# Patient Record
Sex: Female | Born: 1955 | Race: White | Hispanic: No | Marital: Single | Smoking: Current every day smoker
Health system: Southern US, Community
[De-identification: ages and names within clinical notes are randomized; demographics above are authoritative.]

## PROBLEM LIST (undated history)

## (undated) DIAGNOSIS — N183 Chronic kidney disease, stage 3 unspecified: Secondary | ICD-10-CM

## (undated) DIAGNOSIS — K589 Irritable bowel syndrome without diarrhea: Secondary | ICD-10-CM

## (undated) DIAGNOSIS — K604 Rectal fistula, unspecified: Secondary | ICD-10-CM

## (undated) DIAGNOSIS — K5090 Crohn's disease, unspecified, without complications: Secondary | ICD-10-CM

## (undated) HISTORY — DX: Chronic kidney disease, stage 3 unspecified: N18.30

## (undated) HISTORY — DX: Rectal fistula, unspecified: K60.40

## (undated) HISTORY — PX: CHOLECYSTECTOMY: SHX55

## (undated) HISTORY — DX: Rectal fistula: K60.4

## (undated) HISTORY — DX: Chronic kidney disease, stage 3 (moderate): N18.3

## (undated) HISTORY — DX: Crohn's disease, unspecified, without complications: K50.90

## (undated) HISTORY — PX: OTHER SURGICAL HISTORY: SHX169

---

## 2004-06-15 ENCOUNTER — Ambulatory Visit: Payer: Self-pay

## 2004-08-01 ENCOUNTER — Emergency Department: Payer: Self-pay | Admitting: Emergency Medicine

## 2004-11-23 ENCOUNTER — Ambulatory Visit: Payer: Self-pay

## 2005-01-05 ENCOUNTER — Ambulatory Visit: Payer: Self-pay | Admitting: Gastroenterology

## 2005-01-26 ENCOUNTER — Ambulatory Visit: Payer: Self-pay | Admitting: Gastroenterology

## 2005-02-14 ENCOUNTER — Ambulatory Visit: Payer: Self-pay | Admitting: Gastroenterology

## 2006-09-04 ENCOUNTER — Ambulatory Visit: Payer: Self-pay | Admitting: Gastroenterology

## 2006-09-27 ENCOUNTER — Ambulatory Visit: Payer: Self-pay | Admitting: Gastroenterology

## 2008-04-14 IMAGING — CT CT ABD-PELV W/ CM
1 of 3 series · 12 of 32 positions shown, 18 images · non-contrast
Comparison: none

REASON FOR EXAM: Crohn's  Disease Large and Small Intestine
COMMENTS:

PROCEDURE:     CT  - CT ABDOMEN / PELVIS  W  - September 27, 2006  [DATE]
RESULT:
TECHNIQUE: Axial images were obtained from the hemidiaphragm to the pubic
symphysis status post intravenous and oral administration of contrast
material.

[Series 2: soft tissue · axial · 0.84mm/px · z∈[-353,+71]mm · 12 of 63 slices shown, 18 images]
[im 5/63  soft-tissue]
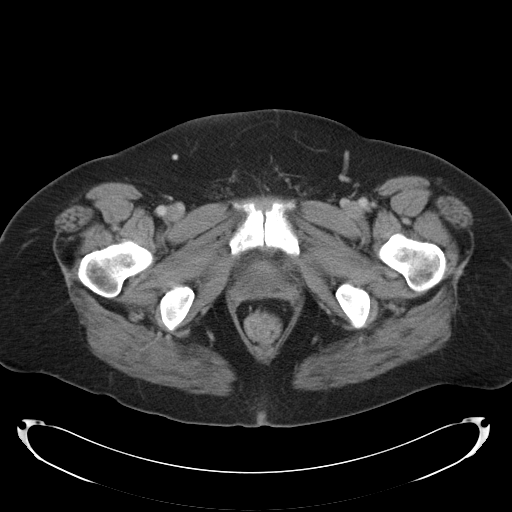
[im 5/63  bone]
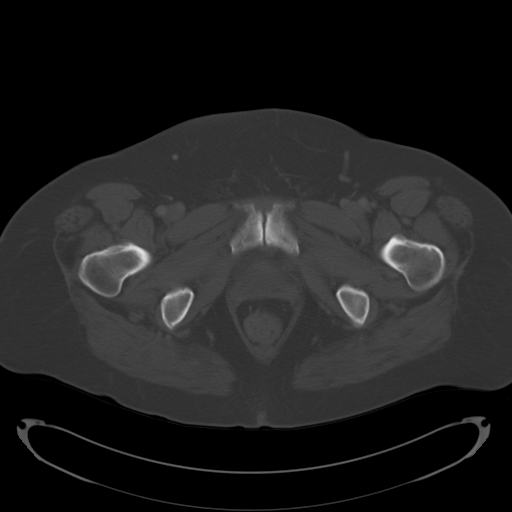
[im 10/63  soft-tissue]
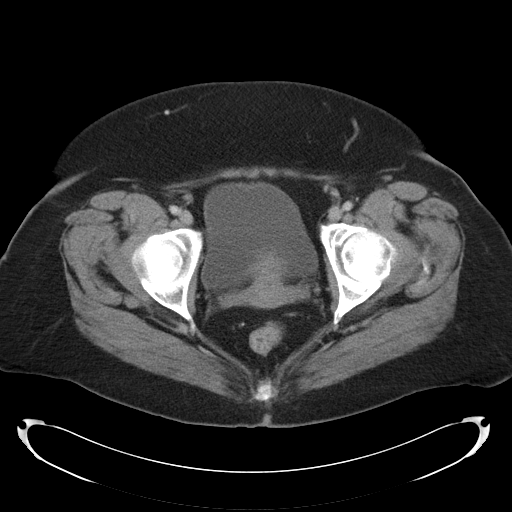
[im 15/63  soft-tissue]
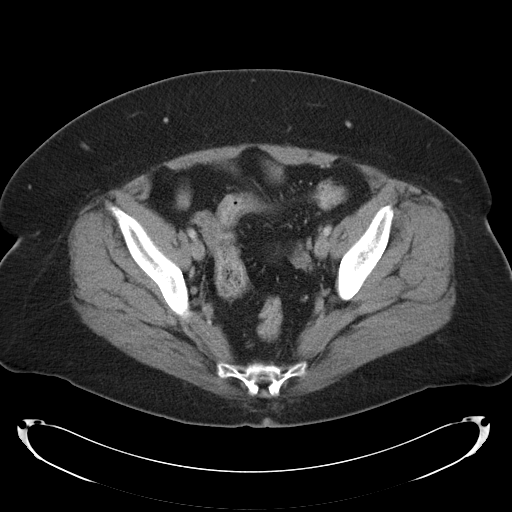
[im 20/63  soft-tissue]
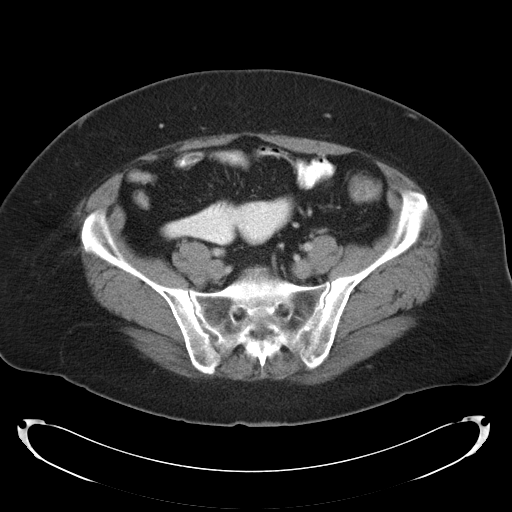
[im 24/63  soft-tissue]
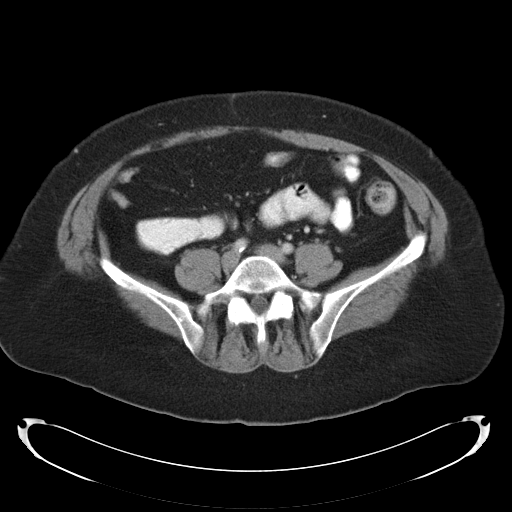
[im 29/63  soft-tissue]
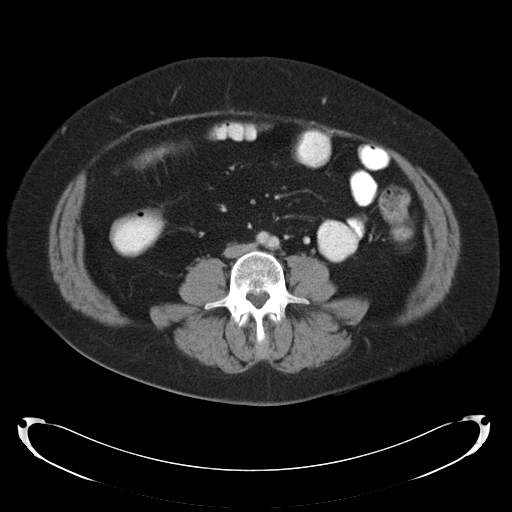
[im 34/63  soft-tissue]
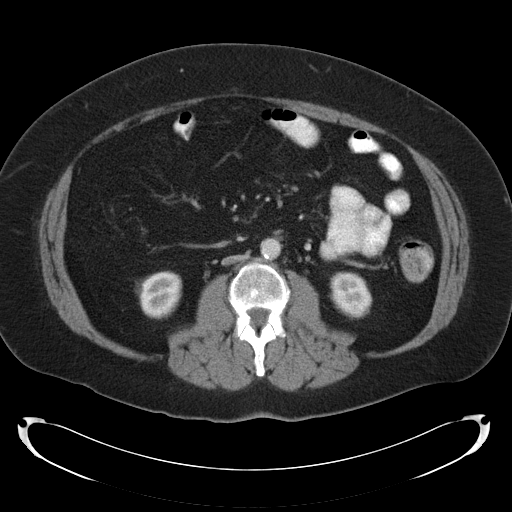
[im 39/63  soft-tissue]
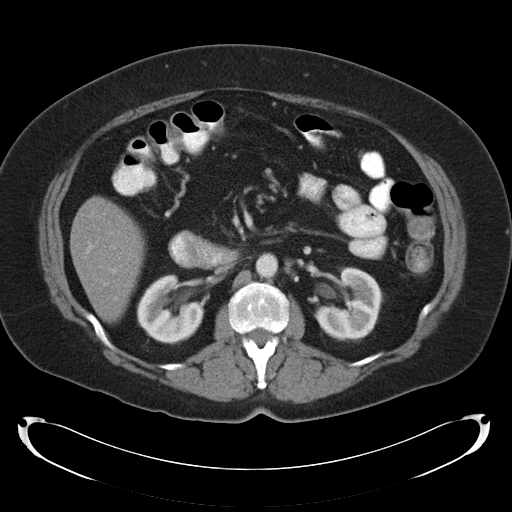
[im 43/63  soft-tissue]
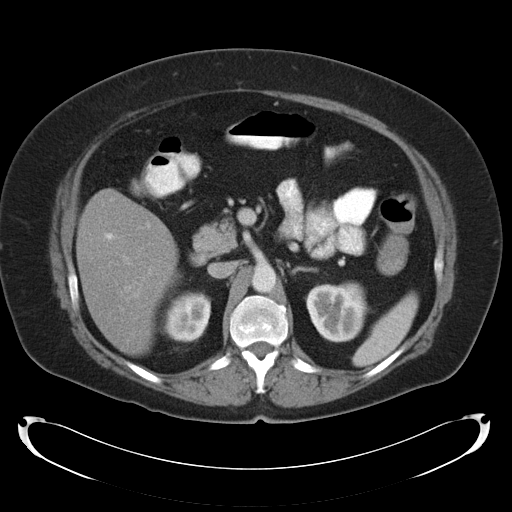
[im 43/63  lung]
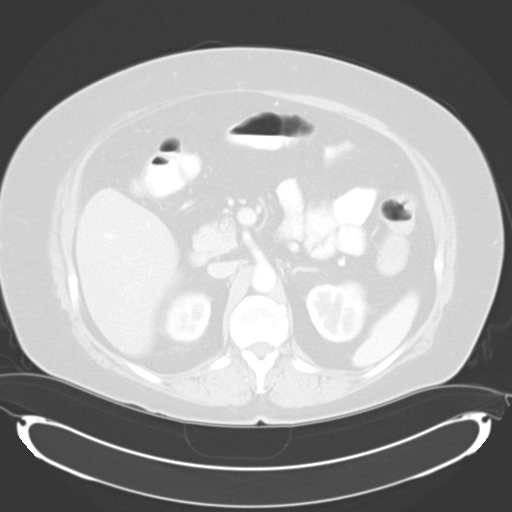
[im 43/63  bone]
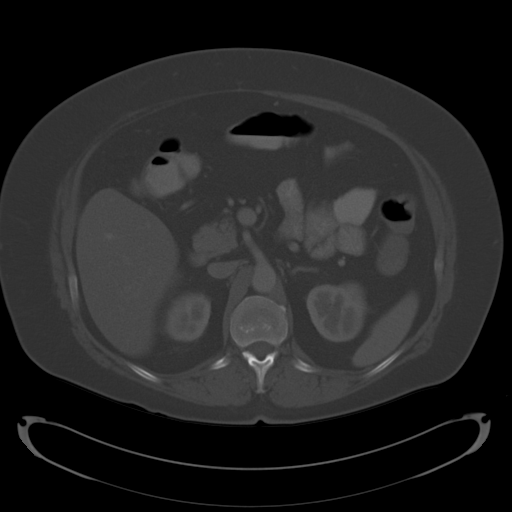
[im 48/63  soft-tissue]
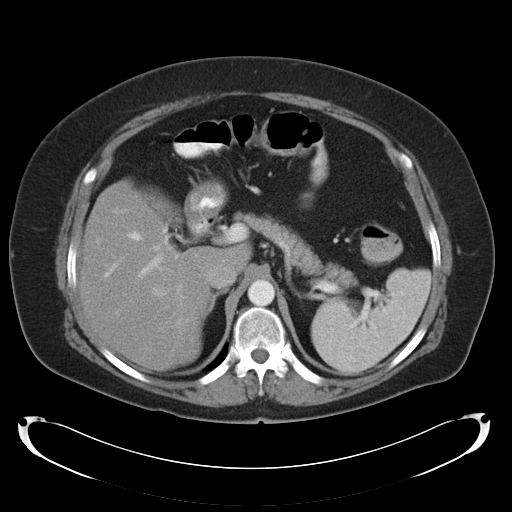
[im 48/63  lung]
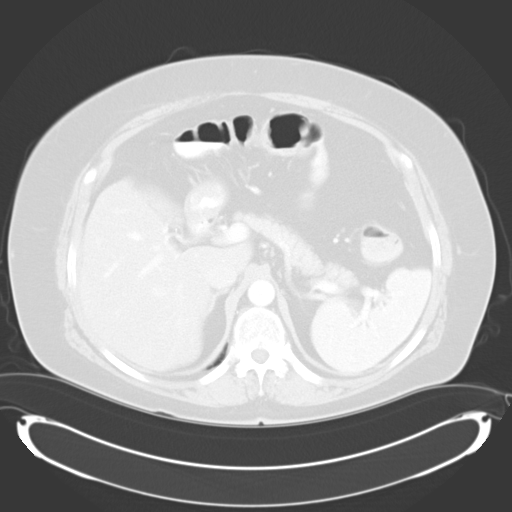
[im 53/63  soft-tissue]
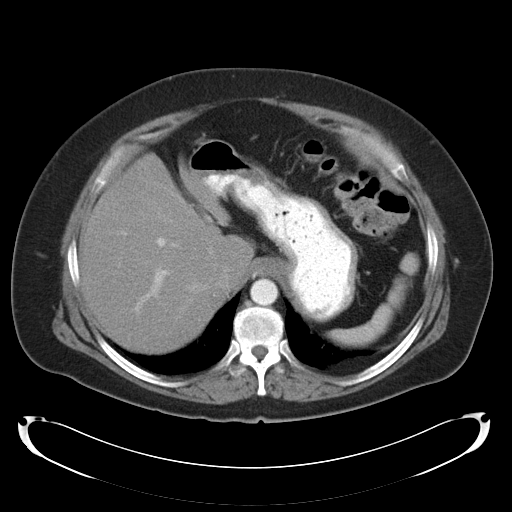
[im 53/63  lung]
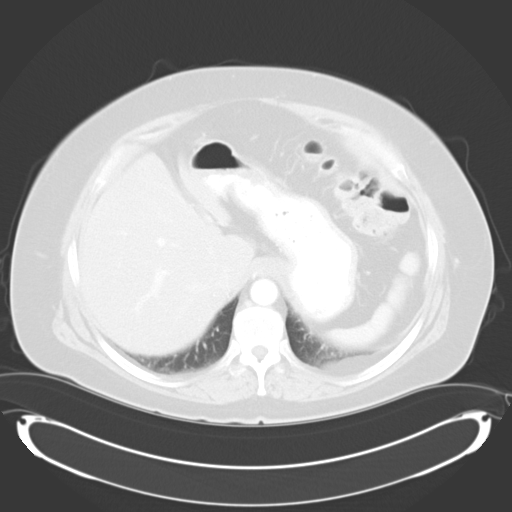
[im 58/63  soft-tissue]
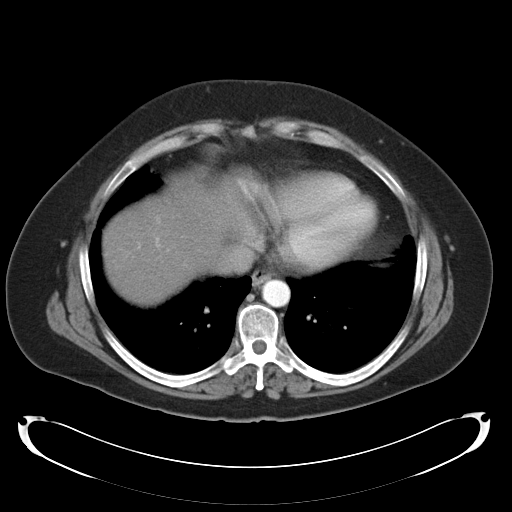
[im 58/63  lung]
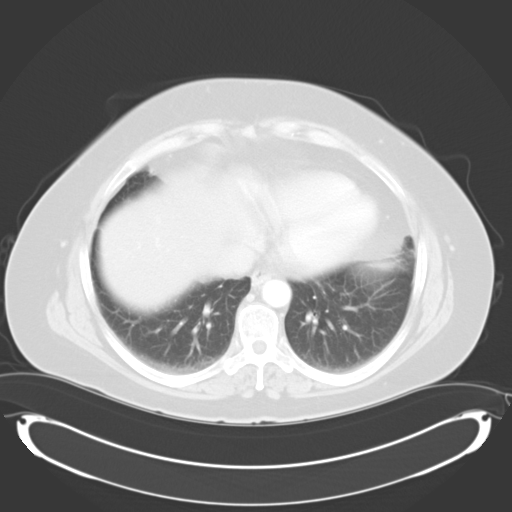

[12 of 32 positions shown; findings below may reference images not displayed]

FINDINGS: The liver and spleen appear intact.  The patient is status post
cholecystectomy.  Adrenal glands are intact.  Both kidneys excrete the
contrast.  There appears a cyst in the mid LEFT kidney. There is a loop of
small bowel in the abdomen on the RIGHT with thickened wall.  Some
dilatation towards the midline. This could be compatible with the patient's
Crohn's disease. There also may be some involvement distally just above the
urinary bladder anteriorly.  There appears some thickening of the bowel. No
ascites is present. No adenopathy is visualized. The lung bases are clear.
IMPRESSION: Some thickened loops of small bowel which can be associated with patient's
known Chron's. LEFT renal cyst present.

## 2009-03-04 ENCOUNTER — Ambulatory Visit: Payer: Self-pay | Admitting: Gastroenterology

## 2009-05-28 ENCOUNTER — Ambulatory Visit: Payer: Self-pay | Admitting: Gastroenterology

## 2009-07-12 ENCOUNTER — Other Ambulatory Visit: Payer: Self-pay | Admitting: Nephrology

## 2009-07-13 ENCOUNTER — Observation Stay: Payer: Self-pay | Admitting: Nephrology

## 2011-01-29 IMAGING — US ULTRASOUND CORE BIOPSY
1 series · 18 of 21 positions shown · non-contrast
Comparison: none

REASON FOR EXAM: ckd 3, subnephrotic proteinuria, positive Torres Jara
COMMENTS:

PROCEDURE:     US  - US GUIDED BX/ASPIRATION NOT BR  - July 13, 2009 [DATE]
RESULT:     Ultrasound-directed biopsy was performed by Dr. Balaoui. Images
obtained revealed no focal abnormality.

[Series 1: ultrasound core biopsy · 18 of 21 slices shown]
[im 1/21]
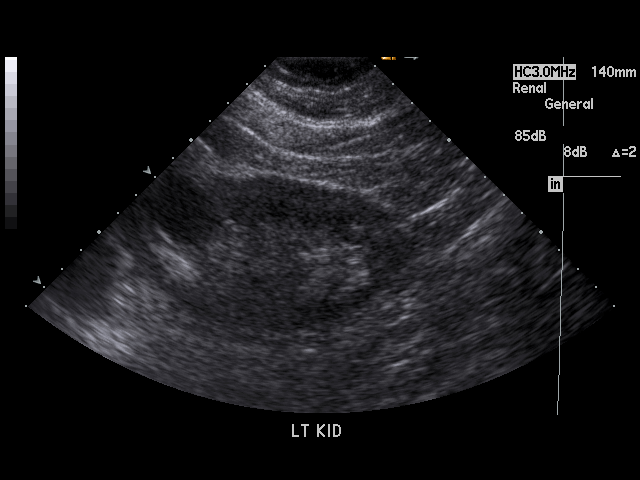
[im 2/21]
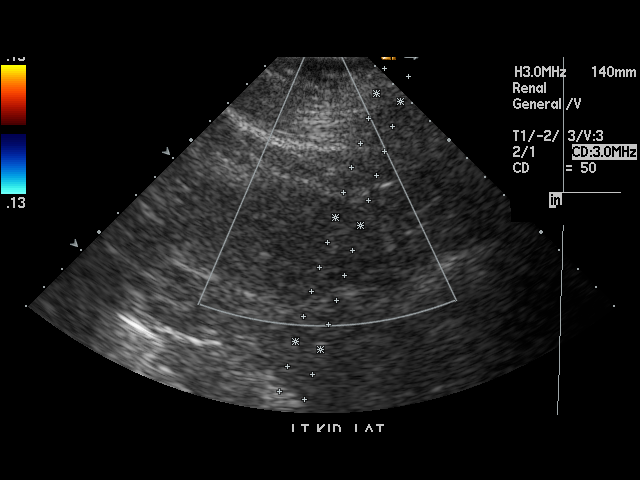
[im 3/21]
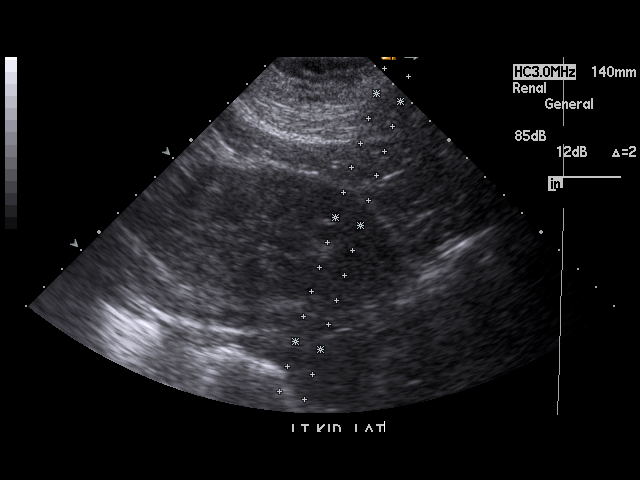
[im 5/21]
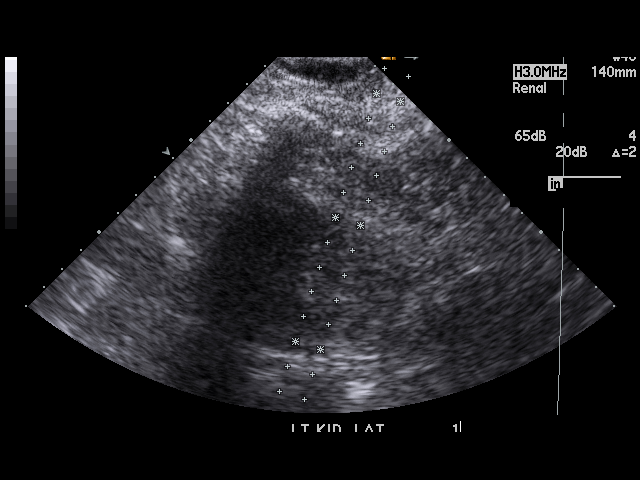
[im 6/21]
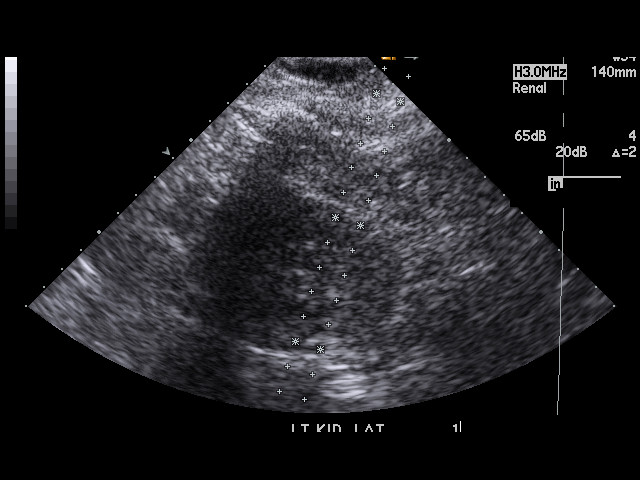
[im 7/21]
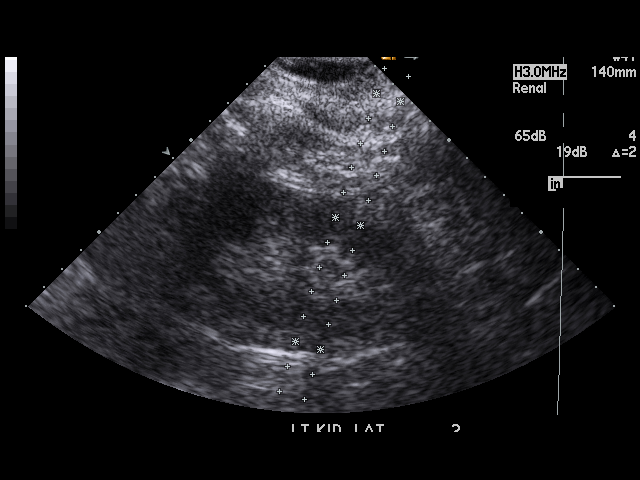
[im 8/21]
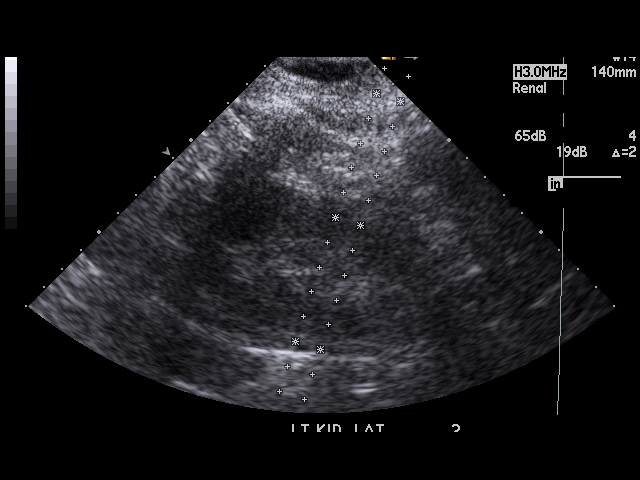
[im 9/21]
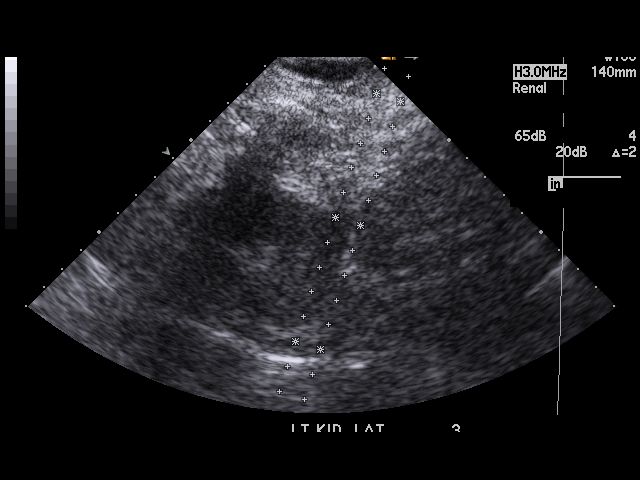
[im 10/21]
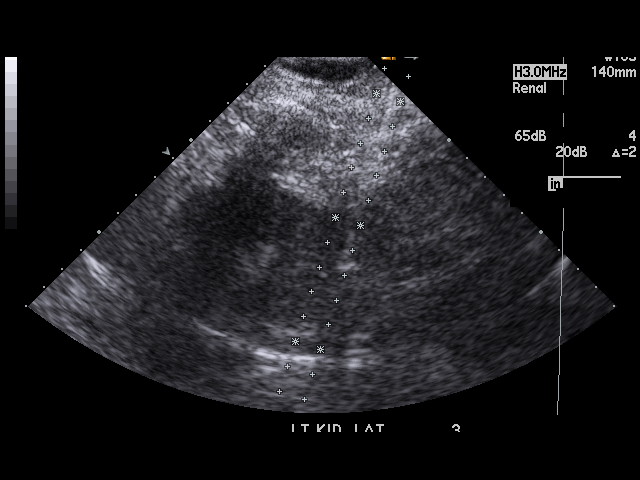
[im 12/21]
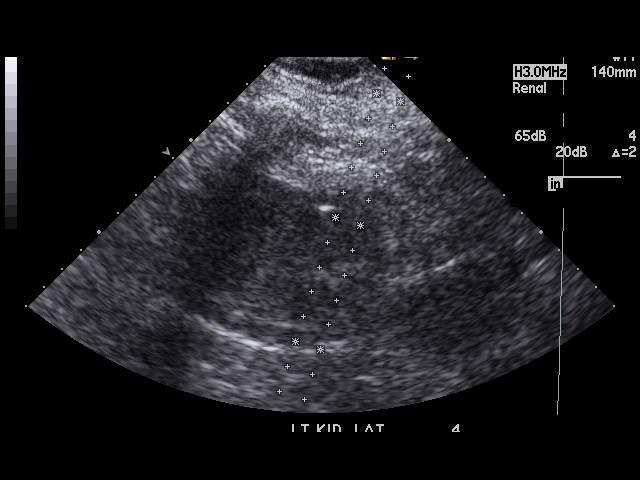
[im 13/21]
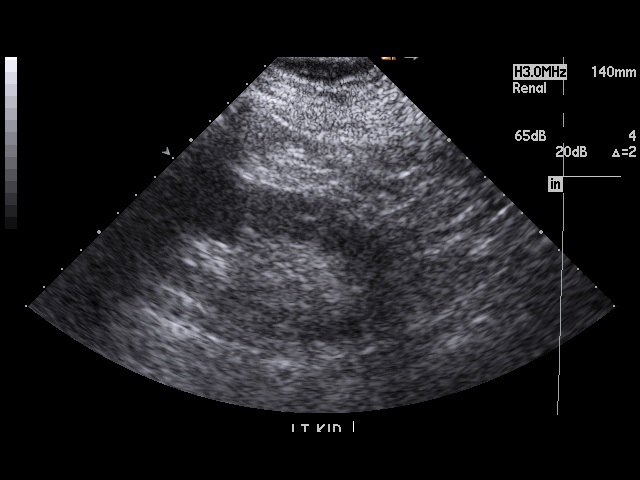
[im 14/21]
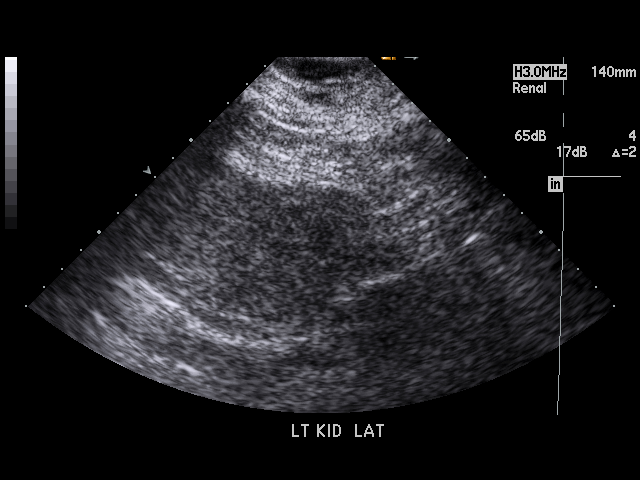
[im 15/21]
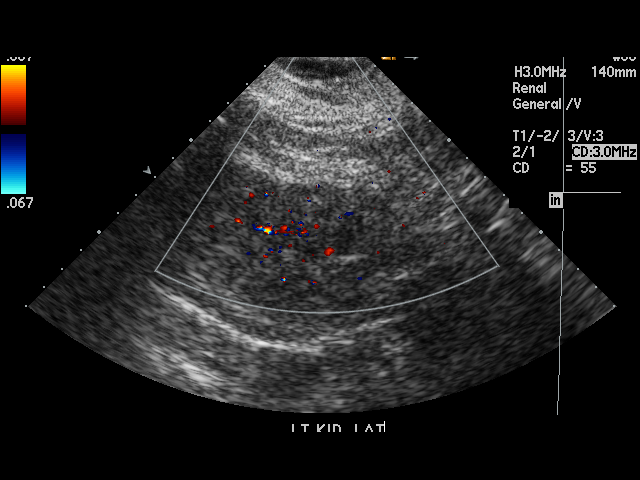
[im 16/21]
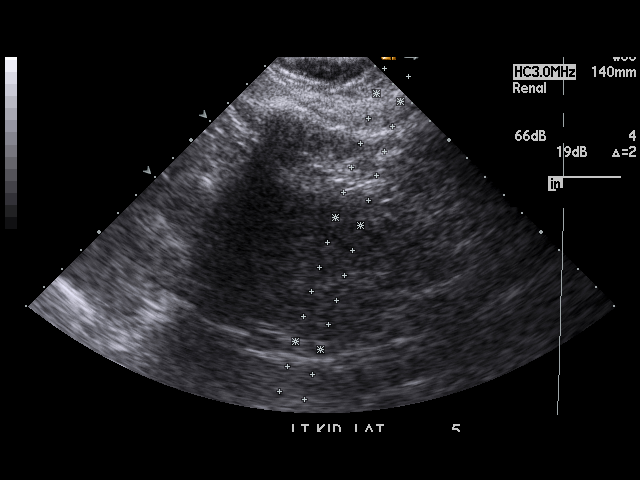
[im 17/21]
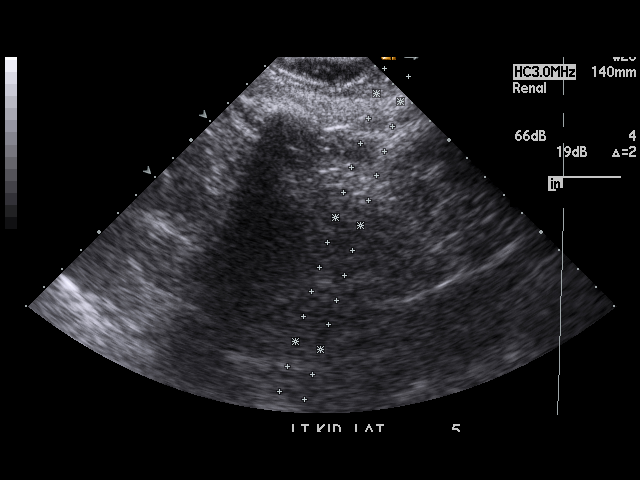
[im 19/21]
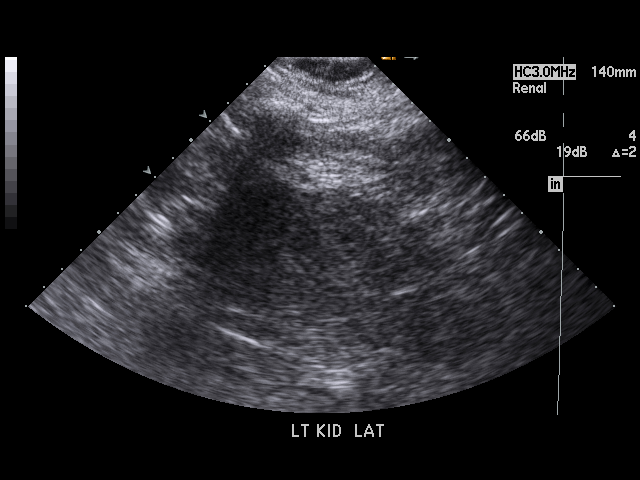
[im 20/21]
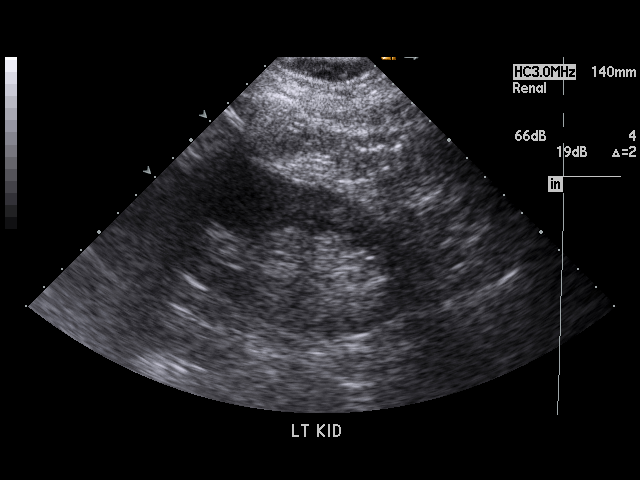
[im 21/21]
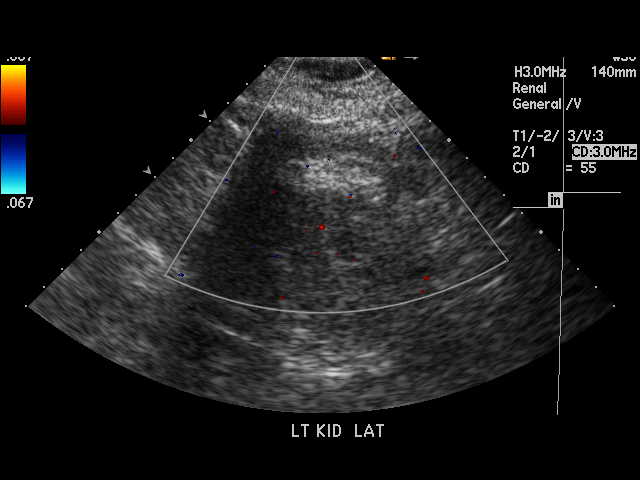

[18 of 21 positions shown; findings below may reference images not displayed]

IMPRESSION: 1. No focal abnormality.

## 2016-06-13 ENCOUNTER — Telehealth: Payer: Self-pay | Admitting: Gastroenterology

## 2016-06-13 NOTE — Telephone Encounter (Signed)
Left voice message for patient to call and schedule with GI for Crohn's disease. [facility]

## 2016-07-19 ENCOUNTER — Ambulatory Visit: Payer: BC Managed Care – PPO | Admitting: Gastroenterology

## 2016-07-19 ENCOUNTER — Other Ambulatory Visit
Admission: RE | Admit: 2016-07-19 | Discharge: 2016-07-19 | Disposition: A | Discharge status: Home / Self Care | Payer: BC Managed Care – PPO | Source: Ambulatory Visit | Attending: Gastroenterology | Admitting: Gastroenterology

## 2016-07-19 ENCOUNTER — Ambulatory Visit (INDEPENDENT_AMBULATORY_CARE_PROVIDER_SITE_OTHER): Payer: BC Managed Care – PPO | Admitting: Gastroenterology

## 2016-07-19 ENCOUNTER — Encounter: Payer: Self-pay | Admitting: Gastroenterology

## 2016-07-19 VITALS — BP 125/79 | HR 84 | Temp 98.2°F | Ht 67.0 in | Wt 199.5 lb

## 2016-07-19 DIAGNOSIS — N183 Chronic kidney disease, stage 3 unspecified: Secondary | ICD-10-CM | POA: Insufficient documentation

## 2016-07-19 DIAGNOSIS — K604 Rectal fistula: Secondary | ICD-10-CM | POA: Insufficient documentation

## 2016-07-19 DIAGNOSIS — K50919 Crohn's disease, unspecified, with unspecified complications: Secondary | ICD-10-CM | POA: Insufficient documentation

## 2016-07-19 LAB — C-REACTIVE PROTEIN

## 2016-07-19 NOTE — Progress Notes (Signed)
Gastroenterology Consultation  Referring Provider:     Dr. Cherylann Ratel Primary Care Physician:  No PCP Per Patient Primary Gastroenterologist:  Dr. Servando Snare     Reason for Consultation:     Crohn's disease        HPI:   Robin Baker is a 61 y.o. y/o female referred for consultation & management of Crohn's disease by Dr. Bonnetta Barry PCP Per Patient.  This patient comes today with a history of Crohn's disease since the late 1990s. The patient states that she had been followed by Dr. Marva Panda in the past but during her care with him she reports that she went into renal failure despite having multiple labs that indicated that her kidneys were deteriorating she feels that they did not address it correctly. The patient has not been seen by them since 2011. The patient also reports that she has not had a colonoscopy many years. The patient now reports that she is doing well but one establish care with a another gastrologist. The patient states she always has diarrhea but is not having any rectal bleeding or bloody stools. The patient also denies any abdominal pain or unexplained weight loss. 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 The patient also reports that she had been on methotrexate in the past but is presently not taking any medication for her Crohn's disease. On surgery in the past with some of her intestines taken out but she is not sure as part of her intestines were removed.  Past Medical History:  Diagnosis Date  . Chronic kidney disease (CKD), stage III (moderate)   . Crohn's disease (HCC)   . Perirectal fistula     Past Surgical History:  Procedure Laterality Date  . CHOLECYSTECTOMY    . ileocolectomy      Prior to Admission medications   Medication Sig Start Date End Date Taking? Authorizing Provider  Calcium Carb-Cholecalciferol (CALCIUM 600/VITAMIN D3 PO) Take by mouth daily.   Yes Historical Provider, MD  Cobalamine Combinations (VITAMIN  B12-FOLIC ACID) 500-400 MCG TABS Take by mouth.   Yes Historical Provider, MD  enalapril (VASOTEC) 10 MG tablet Take 10 mg by mouth daily.   Yes Historical Provider, MD  folic acid (FOLVITE) 1 MG tablet Take 1 mg by mouth daily.   Yes Historical Provider, MD  GARCINIA CAMBOGIA-CHROMIUM PO Take by mouth.   Yes Historical Provider, MD  Omega-3 Fatty Acids (FISH OIL) 1000 MG CAPS Take by mouth.   Yes Historical Provider, MD    Family History  Problem Relation Age of Onset  . GER disease Mother      Social History  Substance Use Topics  . Smoking status: Current Every Day Smoker  . Smokeless tobacco: Never Used  . Alcohol use No    Allergies as of 07/19/2016 - Review Complete 07/19/2016  Allergen Reaction Noted  . Imuran [azathioprine]  07/19/2016    Review of Systems:    All systems reviewed and negative except where noted in HPI.   Physical Exam:  BP 125/79   Pulse 84   Temp 98.2 F (36.8 C) (Oral)   Ht 5\' 7"  (1.702 m)   Wt 199 lb 8 oz (90.5 kg)   BMI 31.25 kg/m  No LMP recorded. Psych:  Alert and cooperative. Normal mood and affect. General:   Alert,  Well-developed, well-nourished, pleasant and cooperative in NAD Head:  Normocephalic and atraumatic. Eyes:  Sclera clear, no icterus.   Conjunctiva  pink. Ears:  Normal auditory acuity. Nose:  No deformity, discharge, or lesions. Mouth:  No deformity or lesions,oropharynx pink & moist. Neck:  Supple; no masses or thyromegaly. Lungs:  Respirations even and unlabored.  Clear throughout to auscultation.   No wheezes, crackles, or rhonchi. No acute distress. Heart:  Regular rate and rhythm; no murmurs, clicks, rubs, or gallops. Abdomen:  Normal bowel sounds.  No bruits.  Soft, non-tender and non-distended without masses, hepatosplenomegaly or hernias noted.  No guarding or rebound tenderness.  Negative Carnett sign.   Rectal:  Rectal exam showed some purulent material around a opening that was perianal consistent with fistula.  There is also a soft fibrous submucosal lesion consistent with a fibrous tract..  Msk:  Symmetrical without gross deformities.  Good, equal movement & strength bilaterally. Pulses:  Normal pulses noted. Extremities:  No clubbing or edema.  No cyanosis. Neurologic:  Alert and oriented x3;  grossly normal neurologically. Skin:  Intact without significant lesions or rashes.  No jaundice. Lymph Nodes:  No significant cervical adenopathy. Psych:  Alert and cooperative. Normal mood and affect.  Imaging Studies: No results found.  Assessment and Plan:   Robin Baker is a 61 y.o. y/o female with a long history of Crohn's disease. Patient is presently not taking any medication for her Crohn's disease. The patient will be set up for a blood test for C-reactive protein. She will also be set up for colonoscopy for screening purposes since she has not had a colonoscopy in many years. If the screening colonoscopy shows her to have fistula disease she may need to be started on medication for her Crohn's versus antibiotics. The patient has been explained the plan and agrees with it.    Robin Miniumarren Jenise Iannelli, MD. Clementeen GrahamFACG   Note: This dictation was prepared with Dragon dictation along with smaller phrase technology. Any transcriptional errors that result from this process are unintentional.

## 2016-07-28 ENCOUNTER — Telehealth: Payer: Self-pay

## 2016-07-28 NOTE — Telephone Encounter (Signed)
Pt notified of lab results

## 2016-07-28 NOTE — Telephone Encounter (Signed)
-----   Message from Midge Minium, MD sent at 07/20/2016  7:43 AM EDT ----- Let the patient know the CRP was normal.

## 2018-10-19 ENCOUNTER — Encounter: Payer: Self-pay | Admitting: Emergency Medicine

## 2018-10-19 ENCOUNTER — Other Ambulatory Visit: Payer: Self-pay

## 2018-10-19 DIAGNOSIS — M549 Dorsalgia, unspecified: Secondary | ICD-10-CM | POA: Insufficient documentation

## 2018-10-19 DIAGNOSIS — Z5321 Procedure and treatment not carried out due to patient leaving prior to being seen by health care provider: Secondary | ICD-10-CM | POA: Diagnosis not present

## 2018-10-19 LAB — URINALYSIS, ROUTINE W REFLEX MICROSCOPIC
Bilirubin Urine: NEGATIVE
Glucose, UA: NEGATIVE mg/dL
Hgb urine dipstick: NEGATIVE
Ketones, ur: NEGATIVE mg/dL
Nitrite: NEGATIVE
Protein, ur: NEGATIVE mg/dL
Specific Gravity, Urine: 1.006 (ref 1.005–1.030)
Squamous Epithelial / LPF: NONE SEEN (ref 0–5)
pH: 6 (ref 5.0–8.0)

## 2018-10-19 NOTE — ED Triage Notes (Addendum)
Pt reports left mid back pain radiating down the back of both legs since this past Sunday; pt says she's been to the chiropractor with no relief; has a history of Stage 3 kidney disease and thinks it may be her kidneys; denies urinary s/s; pt ambulatory with steady gait; pt says pain is worse when she sits longer than 5 minutes or when she moves to stand up

## 2018-10-20 ENCOUNTER — Emergency Department
Admission: EM | Admit: 2018-10-20 | Discharge: 2018-10-20 | Disposition: A | Discharge status: Home / Self Care | Payer: BC Managed Care – PPO | Attending: Emergency Medicine | Admitting: Emergency Medicine

## 2023-04-09 ENCOUNTER — Encounter: Payer: Self-pay | Admitting: Ophthalmology

## 2023-04-09 NOTE — Anesthesia Preprocedure Evaluation (Addendum)
Anesthesia Evaluation  Patient identified by MRN, date of birth, ID band Patient awake    Reviewed: Allergy & Precautions, H&P , NPO status , Patient's Chart, lab work & pertinent test results  Airway Mallampati: III  TM Distance: >3 FB Neck ROM: Full    Dental no notable dental hx. (+) Poor Dentition, Caps Multiple missing teeth, has cap upper central incisor, uncertain whether left or right due to missing teeth and position of remaining tooth; patient denies any loose teeth. :   Pulmonary neg pulmonary ROS, Current Smoker  Smoker,  Pulmonary exam normal breath sounds clear to auscultation       Cardiovascular negative cardio ROS Normal cardiovascular exam Rhythm:Regular Rate:Normal     Neuro/Psych negative neurological ROS  negative psych ROS   GI/Hepatic negative GI ROS, Neg liver ROS,,,  Endo/Other  negative endocrine ROS    Renal/GU Renal diseasenegative Renal ROS  negative genitourinary   Musculoskeletal negative musculoskeletal ROS (+)    Abdominal   Peds negative pediatric ROS (+)  Hematology negative hematology ROS (+)   Anesthesia Other Findings Crohns dz Stage 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  Reproductive/Obstetrics negative OB ROS                              Anesthesia Physical Anesthesia Plan  ASA: 3  Anesthesia Plan: MAC   Post-op Pain Management:    Induction: Intravenous  PONV Risk Score and Plan:   Airway Management Planned: Natural Airway and Nasal Cannula  Additional Equipment:   Intra-op Plan:   Post-operative Plan:   Informed Consent: I have reviewed the patients History and Physical, chart, labs and discussed the procedure including the risks, benefits and alternatives for the proposed anesthesia with the patient or authorized representative who has indicated his/her understanding and acceptance.     Dental Advisory  Given  Plan Discussed with: Anesthesiologist, CRNA and Surgeon  Anesthesia Plan Comments: (Patient consented for risks of anesthesia including but not limited to:  - adverse reactions to medications - damage to eyes, teeth, lips or other oral mucosa - nerve damage due to positioning  - sore throat or hoarseness - Damage to heart, brain, nerves, lungs, other parts of body or loss of life  Patient voiced understanding and assent.)         Anesthesia Quick Evaluation

## 2023-04-11 NOTE — Discharge Instructions (Signed)

## 2023-04-19 ENCOUNTER — Encounter
Admission: RE | Disposition: A | Discharge status: Home / Self Care | Payer: Self-pay | Source: Home / Self Care | Attending: Ophthalmology

## 2023-04-19 ENCOUNTER — Ambulatory Visit: Payer: BC Managed Care – PPO | Admitting: Anesthesiology

## 2023-04-19 ENCOUNTER — Ambulatory Visit
Admission: RE | Admit: 2023-04-19 | Discharge: 2023-04-19 | Disposition: A | Discharge status: Home / Self Care | Payer: BC Managed Care – PPO | Attending: Ophthalmology | Admitting: Ophthalmology

## 2023-04-19 ENCOUNTER — Encounter: Payer: Self-pay | Admitting: Ophthalmology

## 2023-04-19 ENCOUNTER — Other Ambulatory Visit: Payer: Self-pay

## 2023-04-19 DIAGNOSIS — F17210 Nicotine dependence, cigarettes, uncomplicated: Secondary | ICD-10-CM | POA: Insufficient documentation

## 2023-04-19 DIAGNOSIS — H25041 Posterior subcapsular polar age-related cataract, right eye: Secondary | ICD-10-CM | POA: Diagnosis not present

## 2023-04-19 DIAGNOSIS — H2511 Age-related nuclear cataract, right eye: Secondary | ICD-10-CM | POA: Diagnosis present

## 2023-04-19 HISTORY — DX: Irritable bowel syndrome, unspecified: K58.9

## 2023-04-19 HISTORY — PX: CATARACT EXTRACTION W/PHACO: SHX586

## 2023-04-19 SURGERY — PHACOEMULSIFICATION, CATARACT, WITH IOL INSERTION
Anesthesia: Monitor Anesthesia Care | Site: Eye | Laterality: Right

## 2023-04-19 MED ORDER — MIDAZOLAM HCL 2 MG/2ML IJ SOLN
INTRAMUSCULAR | Status: DC | PRN
  Administered 2023-04-19: 2 mg via INTRAVENOUS

## 2023-04-19 MED ORDER — MIDAZOLAM HCL 2 MG/2ML IJ SOLN
INTRAMUSCULAR | Status: AC
  Filled 2023-04-19: qty 2

## 2023-04-19 MED ORDER — MOXIFLOXACIN HCL 0.5 % OP SOLN
OPHTHALMIC | Status: DC | PRN
  Administered 2023-04-19: .2 mL via OPHTHALMIC

## 2023-04-19 MED ORDER — SIGHTPATH DOSE#1 NA HYALUR & NA CHOND-NA HYALUR IO KIT
PACK | INTRAOCULAR | Status: DC | PRN
  Administered 2023-04-19: 1 via OPHTHALMIC

## 2023-04-19 MED ORDER — LIDOCAINE HCL (PF) 2 % IJ SOLN
INTRAOCULAR | Status: DC | PRN
  Administered 2023-04-19: 4 mL via INTRAOCULAR

## 2023-04-19 MED ORDER — SIGHTPATH DOSE#1 BSS IO SOLN
INTRAOCULAR | Status: DC | PRN
  Administered 2023-04-19: 147 mL via OPHTHALMIC

## 2023-04-19 MED ORDER — FENTANYL CITRATE (PF) 100 MCG/2ML IJ SOLN
INTRAMUSCULAR | Status: AC
  Filled 2023-04-19: qty 2

## 2023-04-19 MED ORDER — ARMC OPHTHALMIC DILATING DROPS
OPHTHALMIC | Status: AC
  Filled 2023-04-19: qty 0.5

## 2023-04-19 MED ORDER — TETRACAINE HCL 0.5 % OP SOLN
OPHTHALMIC | Status: AC
  Filled 2023-04-19: qty 4

## 2023-04-19 MED ORDER — TETRACAINE HCL 0.5 % OP SOLN
1.0000 [drp] | OPHTHALMIC | Status: DC | PRN
Start: 2023-04-19 — End: 2023-04-19
  Administered 2023-04-19 (×3): 1 [drp] via OPHTHALMIC

## 2023-04-19 MED ORDER — BRIMONIDINE TARTRATE-TIMOLOL 0.2-0.5 % OP SOLN
OPHTHALMIC | Status: DC | PRN
  Administered 2023-04-19: 1 [drp] via OPHTHALMIC

## 2023-04-19 MED ORDER — FENTANYL CITRATE (PF) 100 MCG/2ML IJ SOLN
INTRAMUSCULAR | Status: DC | PRN
  Administered 2023-04-19: 50 ug via INTRAVENOUS

## 2023-04-19 MED ORDER — ARMC OPHTHALMIC DILATING DROPS
1.0000 | OPHTHALMIC | Status: DC | PRN
  Administered 2023-04-19 (×3): 1 via OPHTHALMIC

## 2023-04-19 MED ORDER — SIGHTPATH DOSE#1 BSS IO SOLN
INTRAOCULAR | Status: DC | PRN
  Administered 2023-04-19: 15 mL via INTRAOCULAR

## 2023-04-19 SURGICAL SUPPLY — 10 items
CATARACT SUITE SIGHTPATH (MISCELLANEOUS) ×1
DISSECTOR HYDRO NUCLEUS 50X22 (MISCELLANEOUS) ×1 IMPLANT
DRSG TEGADERM 2-3/8X2-3/4 SM (GAUZE/BANDAGES/DRESSINGS) ×1 IMPLANT
FEE CATARACT SUITE SIGHTPATH (MISCELLANEOUS) ×1 IMPLANT
GLOVE SURG SYN 7.5 E (GLOVE) ×1
GLOVE SURG SYN 7.5 PF PI (GLOVE) ×1 IMPLANT
GLOVE SURG SYN 8.5 E (GLOVE) ×1
GLOVE SURG SYN 8.5 PF PI (GLOVE) ×1 IMPLANT
LENS IOL RAYNER 14.0 (Intraocular Lens) ×1 IMPLANT
LENS IOL RAYONE EMV 14.0 (Intraocular Lens) IMPLANT

## 2023-04-19 NOTE — Transfer of Care (Signed)
Immediate Anesthesia Transfer of Care Note  Patient: Robin Baker  Procedure(s) Performed: CATARACT EXTRACTION PHACO AND INTRAOCULAR LENS PLACEMENT (IOC) RIGHT 42.52 03:21.2 (Right: Eye)  Patient Location: PACU  Anesthesia Type: MAC  Level of Consciousness: awake, alert  and patient cooperative  Airway and Oxygen Therapy: Patient Spontanous Breathing and Patient connected to supplemental oxygen  Post-op Assessment: Post-op Vital signs reviewed, Patient's Cardiovascular Status Stable, Respiratory Function Stable, Patent Airway and No signs of Nausea or vomiting  Post-op Vital Signs: Reviewed and stable  Complications: No notable events documented.

## 2023-04-19 NOTE — H&P (Signed)
[facility]   Primary Care Physician:  Patient, No Pcp Per Ophthalmologist: Dr. Deberah Pelton  Pre-Procedure History & Physical: HPI:  Robin Baker is a 67 y.o. female here for cataract surgery.   Past Medical History:  Diagnosis Date   Chronic kidney disease (CKD), stage III (moderate) (HCC)    Crohn's disease (HCC)    IBS (irritable bowel syndrome)    Perirectal fistula     Past Surgical History:  Procedure Laterality Date   CHOLECYSTECTOMY     ileocolectomy      Prior to Admission medications   Medication Sig Start Date End Date Taking? Authorizing Provider  Cyanocobalamin (VITAMIN B-12 IJ) Inject 1,000 mcg as directed every 30 (thirty) days.   Yes [provider]  enalapril (VASOTEC) 2.5 MG tablet Take 2.5 mg by mouth daily.   Yes [provider]  ferrous gluconate (FERGON) 324 MG tablet Take 324 mg by mouth 3 (three) times a week. Sat, Sun, Mon   Yes [provider]  gabapentin (NEURONTIN) 300 MG capsule Take 300 mg by mouth at bedtime.   Yes [provider]  MAGNESIUM PO Take 266 mg by mouth daily.   Yes [provider]  Omega-3 Fatty Acids (FISH OIL) 1000 MG CAPS Take 8,000 mg by mouth daily.   Yes [provider]    Allergies as of 02/15/2023 - Review Complete 10/19/2018  Allergen Reaction Noted   Imuran [azathioprine]  07/19/2016    Family History  Problem Relation Age of Onset   GER disease Mother     Social History   Socioeconomic History   Marital status: Single    Spouse name: Not on file   Number of children: Not on file   Years of education: Not on file   Highest education level: Not on file  Occupational History   Not on file  Tobacco Use   Smoking status: Every Day    Current packs/day: 1.00    Average packs/day: 1 pack/day for 27.0 years (27.0 ttl pk-yrs)    Types: Cigarettes    Start date: 1998   Smokeless tobacco: Never  Vaping Use   Vaping status: Never Used  Substance  and Sexual Activity   Alcohol use: No   Drug use: No   Sexual activity: Not on file  Other Topics Concern   Not on file  Social History Narrative   Not on file   Social Drivers of Health   Financial Resource Strain: Low Risk  (10/23/2022)   Received from [facility]   Overall Financial Resource Strain (CARDIA)    Difficulty of Paying Living Expenses: Not very hard  Food Insecurity: No Food Insecurity (03/13/2023)   Received from [facility]   Hunger Vital Sign    Worried About Running Out of Food in the Last Year: Never true    Ran Out of Food in the Last Year: Never true  Transportation Needs: No Transportation Needs (11/06/2022)   Received from [facility]   PRAPARE - Transportation    Lack of Transportation (Medical): No    Lack of Transportation (Non-Medical): No  Physical Activity: Not on file  Stress: Not on file  Social Connections: Not on file  Intimate Partner Violence: Not on file    Review of Systems: See HPI, otherwise negative ROS  Physical Exam: Ht 5\' 6"  (1.676 m)   Wt 87.1 kg   BMI 30.99 kg/m  General:   Alert, cooperative in NAD Head:  Normocephalic  and atraumatic. Respiratory:  Normal work of breathing. Cardiovascular:  RRR  Impression/Plan: Robin Baker is here for cataract surgery.  Risks, benefits, limitations, and alternatives regarding cataract surgery have been reviewed with the patient.  Questions have been answered.  All parties agreeable.   Estanislado Pandy, MD  04/19/2023, 9:04 AM

## 2023-04-19 NOTE — Op Note (Signed)
OPERATIVE NOTE  Robin Baker [id number] 04/19/2023   PREOPERATIVE DIAGNOSIS: Nuclear sclerotic cataract right eye. H25.11   POSTOPERATIVE DIAGNOSIS: Nuclear sclerotic cataract right eye. H25.11   PROCEDURE:  Phacoemusification with posterior chamber intraocular lens placement of the right eye  Ultrasound time: Procedure(s): CATARACT EXTRACTION PHACO AND INTRAOCULAR LENS PLACEMENT (IOC) RIGHT 42.52 03:21.2 (Right)  LENS:   Implant Name Type Inv. Item Serial No. Manufacturer Lot No. LRB No. Used Action  LENS IOL RAYNER 14.0 - [id number] Intraocular Lens LENS IOL RAYNER 14.0 59 [employer] [id number] Right 1 Implanted      SURGEON:  Julious Payer. Rolley Sims, MD   ANESTHESIA:  Topical with tetracaine drops, augmented with 1% preservative-free intracameral lidocaine.   COMPLICATIONS:  None.   DESCRIPTION OF PROCEDURE:  The patient was identified in the holding room and transported to the operating room and placed in the supine position under the operating microscope.  The right eye was identified as the operative eye, which was prepped and draped in the usual sterile ophthalmic fashion.   A 1 millimeter clear-corneal paracentesis was made superotemporally. Preservative-free 1% lidocaine mixed with 1:1,000 bisulfite-free aqueous solution of epinephrine was injected into the anterior chamber. The anterior chamber was then filled with Viscoat viscoelastic. A 2.4 millimeter keratome was used to make a clear-corneal incision inferotemporally. A curvilinear capsulorrhexis was made with a cystotome and capsulorrhexis forceps. Balanced salt solution was used to hydrodissect and hydrodelineate the nucleus. Phacoemulsification was then used to remove the lens nucleus and epinucleus. The remaining cortex was then removed using the irrigation and aspiration handpiece. Provisc was then placed into the capsular bag to distend it for lens placement. A +14.00 D [id number] intraocular lens was then injected into the capsular  bag. The remaining viscoelastic was aspirated.   Wounds were hydrated with balanced salt solution.  The anterior chamber was inflated to a physiologic pressure with balanced salt solution.  No wound leaks were noted. Vigamox was injected intracamerally.  Timolol and Brimonidine drops were applied to the eye.  The patient was taken to the recovery room in stable condition without complications of anesthesia or surgery.  [name] 04/19/2023, 2:06 PM

## 2023-04-19 NOTE — Anesthesia Postprocedure Evaluation (Signed)
Anesthesia Post Note  Patient: Rene Paige  Procedure(s) Performed: CATARACT EXTRACTION PHACO AND INTRAOCULAR LENS PLACEMENT (IOC) RIGHT 42.52 03:21.2 (Right: Eye)  Patient location during evaluation: PACU Anesthesia Type: MAC Level of consciousness: awake and alert Pain management: pain level controlled Vital Signs Assessment: post-procedure vital signs reviewed and stable Respiratory status: spontaneous breathing, nonlabored ventilation, respiratory function stable and patient connected to nasal cannula oxygen Cardiovascular status: stable and blood pressure returned to baseline Postop Assessment: no apparent nausea or vomiting Anesthetic complications: no   No notable events documented.   Last Vitals:  Vitals:   04/19/23 1406 04/19/23 1409  BP: 95/69 104/68  Pulse: 74 72  Resp: 14 14  Temp: (!) 36.2 C (!) 36.2 C  SpO2: 97% 97%    Last Pain:  Vitals:   04/19/23 1409  TempSrc:   PainSc: 0-No pain                 Venda Dice C Jataya Wann

## 2023-04-20 ENCOUNTER — Encounter: Payer: Self-pay | Admitting: Ophthalmology

## 2023-05-22 ENCOUNTER — Encounter: Payer: Self-pay | Admitting: Ophthalmology

## 2023-06-05 NOTE — Discharge Instructions (Signed)

## 2023-06-07 ENCOUNTER — Other Ambulatory Visit: Payer: Self-pay

## 2023-06-07 ENCOUNTER — Ambulatory Visit
Admission: RE | Admit: 2023-06-07 | Discharge: 2023-06-07 | Disposition: A | Discharge status: Home / Self Care | Payer: 59 | Attending: Ophthalmology | Admitting: Ophthalmology

## 2023-06-07 ENCOUNTER — Ambulatory Visit: Payer: 59 | Admitting: Anesthesiology

## 2023-06-07 ENCOUNTER — Encounter: Payer: Self-pay | Admitting: Ophthalmology

## 2023-06-07 ENCOUNTER — Encounter
Admission: RE | Disposition: A | Discharge status: Home / Self Care | Payer: Self-pay | Source: Home / Self Care | Attending: Ophthalmology

## 2023-06-07 DIAGNOSIS — F17210 Nicotine dependence, cigarettes, uncomplicated: Secondary | ICD-10-CM | POA: Diagnosis not present

## 2023-06-07 DIAGNOSIS — H2512 Age-related nuclear cataract, left eye: Secondary | ICD-10-CM | POA: Insufficient documentation

## 2023-06-07 DIAGNOSIS — K5090 Crohn's disease, unspecified, without complications: Secondary | ICD-10-CM | POA: Insufficient documentation

## 2023-06-07 DIAGNOSIS — N183 Chronic kidney disease, stage 3 unspecified: Secondary | ICD-10-CM | POA: Diagnosis not present

## 2023-06-07 HISTORY — PX: CATARACT EXTRACTION W/PHACO: SHX586

## 2023-06-07 SURGERY — PHACOEMULSIFICATION, CATARACT, WITH IOL INSERTION
Anesthesia: Monitor Anesthesia Care | Site: Eye | Laterality: Left

## 2023-06-07 MED ORDER — ARMC OPHTHALMIC DILATING DROPS
1.0000 | OPHTHALMIC | Status: DC | PRN
  Administered 2023-06-07 (×3): 1 via OPHTHALMIC

## 2023-06-07 MED ORDER — MIDAZOLAM HCL 2 MG/2ML IJ SOLN
INTRAMUSCULAR | Status: DC | PRN
  Administered 2023-06-07: 2 mg via INTRAVENOUS

## 2023-06-07 MED ORDER — ARMC OPHTHALMIC DILATING DROPS
OPHTHALMIC | Status: AC
  Filled 2023-06-07: qty 0.5

## 2023-06-07 MED ORDER — SIGHTPATH DOSE#1 BSS IO SOLN
INTRAOCULAR | Status: DC | PRN
  Administered 2023-06-07: 15 mL via INTRAOCULAR

## 2023-06-07 MED ORDER — TETRACAINE HCL 0.5 % OP SOLN
1.0000 [drp] | OPHTHALMIC | Status: DC | PRN
  Administered 2023-06-07 (×3): 1 [drp] via OPHTHALMIC

## 2023-06-07 MED ORDER — FENTANYL CITRATE (PF) 100 MCG/2ML IJ SOLN
INTRAMUSCULAR | Status: DC | PRN
  Administered 2023-06-07: 50 ug via INTRAVENOUS

## 2023-06-07 MED ORDER — MIDAZOLAM HCL 2 MG/2ML IJ SOLN
INTRAMUSCULAR | Status: AC
  Filled 2023-06-07: qty 2

## 2023-06-07 MED ORDER — MOXIFLOXACIN HCL 0.5 % OP SOLN
OPHTHALMIC | Status: DC | PRN
  Administered 2023-06-07: .2 mL via OPHTHALMIC

## 2023-06-07 MED ORDER — LIDOCAINE HCL (PF) 2 % IJ SOLN
INTRAOCULAR | Status: DC | PRN
  Administered 2023-06-07: 4 mL via INTRAOCULAR

## 2023-06-07 MED ORDER — SIGHTPATH DOSE#1 NA HYALUR & NA CHOND-NA HYALUR IO KIT
PACK | INTRAOCULAR | Status: DC | PRN
  Administered 2023-06-07: 1 via OPHTHALMIC

## 2023-06-07 MED ORDER — TETRACAINE HCL 0.5 % OP SOLN
OPHTHALMIC | Status: AC
  Filled 2023-06-07: qty 4

## 2023-06-07 MED ORDER — SIGHTPATH DOSE#1 BSS IO SOLN
INTRAOCULAR | Status: DC | PRN
  Administered 2023-06-07: 96 mL via OPHTHALMIC

## 2023-06-07 MED ORDER — BRIMONIDINE TARTRATE-TIMOLOL 0.2-0.5 % OP SOLN
OPHTHALMIC | Status: DC | PRN
  Administered 2023-06-07: 1 [drp] via OPHTHALMIC

## 2023-06-07 MED ORDER — FENTANYL CITRATE (PF) 100 MCG/2ML IJ SOLN
INTRAMUSCULAR | Status: AC
  Filled 2023-06-07: qty 2

## 2023-06-07 SURGICAL SUPPLY — 12 items
CANNULA ANT/CHMB 27G (MISCELLANEOUS) IMPLANT
CANNULA ANT/CHMB 27GA (MISCELLANEOUS)
CATARACT SUITE SIGHTPATH (MISCELLANEOUS) ×1
DISSECTOR HYDRO NUCLEUS 50X22 (MISCELLANEOUS) ×1 IMPLANT
DRSG TEGADERM 2-3/8X2-3/4 SM (GAUZE/BANDAGES/DRESSINGS) ×1 IMPLANT
FEE CATARACT SUITE SIGHTPATH (MISCELLANEOUS) ×1 IMPLANT
GLOVE SURG SYN 7.5 E (GLOVE) ×1
GLOVE SURG SYN 7.5 PF PI (GLOVE) ×1 IMPLANT
GLOVE SURG SYN 8.5 E (GLOVE) ×1
GLOVE SURG SYN 8.5 PF PI (GLOVE) ×1 IMPLANT
LENS IOL RAYNER 14.5 (Intraocular Lens) ×1 IMPLANT
LENS IOL RAYONE EMV 14.5 (Intraocular Lens) IMPLANT

## 2023-06-07 NOTE — Transfer of Care (Signed)
Immediate Anesthesia Transfer of Care Note  Patient: Robin Baker  Procedure(s) Performed: CATARACT EXTRACTION PHACO AND INTRAOCULAR LENS PLACEMENT (IOC) LEFT 21.33 01:31.4 (Left: Eye)  Patient Location: PACU  Anesthesia Type: MAC  Level of Consciousness: awake, alert  and patient cooperative  Airway and Oxygen Therapy: Patient Spontanous Breathing and Patient connected to supplemental oxygen  Post-op Assessment: Post-op Vital signs reviewed, Patient's Cardiovascular Status Stable, Respiratory Function Stable, Patent Airway and No signs of Nausea or vomiting  Post-op Vital Signs: Reviewed and stable  Complications: No notable events documented.

## 2023-06-07 NOTE — Anesthesia Postprocedure Evaluation (Signed)
Anesthesia Post Note  Patient: Robin Baker  Procedure(s) Performed: CATARACT EXTRACTION PHACO AND INTRAOCULAR LENS PLACEMENT (IOC) LEFT 21.33 01:31.4 (Left: Eye)  Patient location during evaluation: PACU Anesthesia Type: MAC Level of consciousness: awake and alert Pain management: pain level controlled Vital Signs Assessment: post-procedure vital signs reviewed and stable Respiratory status: spontaneous breathing, nonlabored ventilation, respiratory function stable and patient connected to nasal cannula oxygen Cardiovascular status: stable and blood pressure returned to baseline Postop Assessment: no apparent nausea or vomiting Anesthetic complications: no   No notable events documented.   Last Vitals:  Vitals:   06/07/23 0936 06/07/23 0940  BP: 109/70 110/72  Pulse: 66 66  Resp: 10 15  Temp: (!) 36.2 C (!) 36.2 C  SpO2: 99% 98%    Last Pain:  Vitals:   06/07/23 0940  PainSc: 0-No pain                 Babette Stum C Corneisha Alvi

## 2023-06-07 NOTE — Op Note (Signed)
OPERATIVE NOTE  Robin Baker [id number] 06/07/2023   PREOPERATIVE DIAGNOSIS: Nuclear sclerotic cataract left eye. H25.12   POSTOPERATIVE DIAGNOSIS: Nuclear sclerotic cataract left eye. H25.12   PROCEDURE:  Phacoemusification with posterior chamber intraocular lens placement of the left eye  Ultrasound time: Procedure(s): CATARACT EXTRACTION PHACO AND INTRAOCULAR LENS PLACEMENT (IOC) LEFT 21.33 01:31.4 (Left)  LENS:   Implant Name Type Inv. Item Serial No. Manufacturer Lot No. LRB No. Used Action  LENS IOL RAYNER 14.5 - [id number] Intraocular Lens LENS IOL RAYNER 14.5  [employer] [id number] Left 1 Implanted      SURGEON:  Julious Payer. Rolley Sims, MD   ANESTHESIA:  Topical with tetracaine drops, augmented with 1% preservative-free intracameral lidocaine.   COMPLICATIONS:  None.   DESCRIPTION OF PROCEDURE:  The patient was identified in the holding room and transported to the operating room and placed in the supine position under the operating microscope.  The left eye was identified as the operative eye, which was prepped and draped in the usual sterile ophthalmic fashion.   A 1 millimeter clear-corneal paracentesis was made inferotemporally. Preservative-free 1% lidocaine mixed with 1:1,000 bisulfite-free aqueous solution of epinephrine was injected into the anterior chamber. The anterior chamber was then filled with Viscoat viscoelastic. A 2.4 millimeter keratome was used to make a clear-corneal incision superotemporally. A curvilinear capsulorrhexis was made with a cystotome and capsulorrhexis forceps. Balanced salt solution was used to hydrodissect and hydrodelineate the nucleus. Phacoemulsification was then used to remove the lens nucleus and epinucleus. The remaining cortex was then removed using the irrigation and aspiration handpiece. Provisc was then placed into the capsular bag to distend it for lens placement. A +14.50 D [id number] intraocular lens was then injected into the capsular  bag. The remaining viscoelastic was aspirated.   Wounds were hydrated with balanced salt solution.  The anterior chamber was inflated to a physiologic pressure with balanced salt solution.  No wound leaks were noted. Moxifloxacin was injected intracamerally.  Timolol and Brimonidine drops were applied to the eye.  The patient was taken to the recovery room in stable condition without complications of anesthesia or surgery.  [name] [place] 06/07/2023, 9:35 AM

## 2023-06-07 NOTE — H&P (Signed)
[facility]   Primary Care Physician:  Aura Camps, FNP Ophthalmologist: Dr. Deberah Pelton  Pre-Procedure History & Physical: HPI:  Robin Baker is a 68 y.o. female here for cataract surgery.   Past Medical History:  Diagnosis Date   Chronic kidney disease (CKD), stage III (moderate) (HCC)    Crohn's disease (HCC)    IBS (irritable bowel syndrome)    Perirectal fistula     Past Surgical History:  Procedure Laterality Date   CATARACT EXTRACTION W/PHACO Right 04/19/2023   Procedure: CATARACT EXTRACTION PHACO AND INTRAOCULAR LENS PLACEMENT (IOC) RIGHT 42.52 03:21.2;  Surgeon: Estanislado Pandy, MD;  Location: [facility];  Service: Ophthalmology;  Laterality: Right;   CHOLECYSTECTOMY     ileocolectomy      Prior to Admission medications   Medication Sig Start Date End Date Taking? Authorizing Provider  Cyanocobalamin (VITAMIN B-12 IJ) Inject 1,000 mcg as directed every 30 (thirty) days.    [provider]  enalapril (VASOTEC) 2.5 MG tablet Take 2.5 mg by mouth daily.    [provider]  ferrous gluconate (FERGON) 324 MG tablet Take 324 mg by mouth 3 (three) times a week. Sat, Sun, Mon    [provider]  gabapentin (NEURONTIN) 300 MG capsule Take 300 mg by mouth at bedtime.    [provider]  MAGNESIUM PO Take 266 mg by mouth daily.    [provider]  Omega-3 Fatty Acids (FISH OIL) 1000 MG CAPS Take 8,000 mg by mouth daily.    [provider]    Allergies as of 05/22/2023 - Review Complete 05/22/2023  Allergen Reaction Noted   Imuran [azathioprine]  07/19/2016   Budesonide  04/09/2023    Family History  Problem Relation Age of Onset   GER disease Mother     Social History   Socioeconomic History   Marital status: Single    Spouse name: Not on file   Number of children: Not on file   Years of education: Not on file   Highest education level: Not on file  Occupational  History   Not on file  Tobacco Use   Smoking status: Every Day    Current packs/day: 1.00    Average packs/day: 1 pack/day for 27.1 years (27.1 ttl pk-yrs)    Types: Cigarettes    Start date: 1998   Smokeless tobacco: Never  Vaping Use   Vaping status: Never Used  Substance and Sexual Activity   Alcohol use: No   Drug use: No   Sexual activity: Not on file  Other Topics Concern   Not on file  Social History Narrative   Not on file   Social Drivers of Health   Financial Resource Strain: Low Risk  (10/23/2022)   Received from [facility]   Overall Financial Resource Strain (CARDIA)    Difficulty of Paying Living Expenses: Not very hard  Food Insecurity: No Food Insecurity (03/13/2023)   Received from [facility]   Hunger Vital Sign    Worried About Running Out of Food in the Last Year: Never true    Ran Out of Food in the Last Year: Never true  Transportation Needs: No Transportation Needs (11/06/2022)   Received from [facility]   PRAPARE - Transportation    Lack of Transportation (Medical): No    Lack of Transportation (Non-Medical): No  Physical Activity: Not on file  Stress: Not on file  Social Connections: Not on file  Intimate Partner Violence:  Not on file    Review of Systems: See HPI, otherwise negative ROS  Physical Exam: Ht 5\' 6"  (1.676 m)   Wt 83 kg   BMI 29.54 kg/m  General:   Alert, cooperative in NAD Head:  Normocephalic and atraumatic. Respiratory:  Normal work of breathing. Cardiovascular:  RRR  Impression/Plan: Robin Baker is here for cataract surgery.  Risks, benefits, limitations, and alternatives regarding cataract surgery have been reviewed with the patient.  Questions have been answered.  All parties agreeable.   Estanislado Pandy, MD  06/07/2023, 7:12 AM

## 2023-06-07 NOTE — Anesthesia Preprocedure Evaluation (Signed)
Anesthesia Evaluation  Patient identified by MRN, date of birth, ID band Patient awake    Reviewed: Allergy & Precautions, H&P , NPO status , Patient's Chart, lab work & pertinent test results  Airway Mallampati: III       Dental   Multiple missing teeth, has cap upper central incisor, uncertain whether left or right due to missing teeth and position of remaining tooth; patient denies any loose teeth. : :   Pulmonary neg pulmonary ROS, Current Smoker          Cardiovascular negative cardio ROS      Neuro/Psych negative neurological ROS  negative psych ROS   GI/Hepatic negative GI ROS, Neg liver ROS,,,  Endo/Other  negative endocrine ROS    Renal/GU Renal diseasenegative Renal ROS  negative genitourinary   Musculoskeletal negative musculoskeletal ROS (+)    Abdominal   Peds negative pediatric ROS (+)  Hematology negative hematology ROS (+)   Anesthesia Other Findings Medical History  Crohn's disease (HCC) Perirectal fistula Chronic kidney disease (CKD), stage III (moderate) (HCC) IBS (irritable bowel syndrome)  Previous cataract 04-19-23  Reproductive/Obstetrics negative OB ROS                              Anesthesia Physical Anesthesia Plan  ASA: 3  Anesthesia Plan: MAC   Post-op Pain Management:    Induction: Intravenous  PONV Risk Score and Plan:   Airway Management Planned: Natural Airway and Nasal Cannula  Additional Equipment:   Intra-op Plan:   Post-operative Plan:   Informed Consent: I have reviewed the patients History and Physical, chart, labs and discussed the procedure including the risks, benefits and alternatives for the proposed anesthesia with the patient or authorized representative who has indicated his/her understanding and acceptance.     Dental Advisory Given  Plan Discussed with: Anesthesiologist, CRNA and Surgeon  Anesthesia Plan Comments:  (Patient consented for risks of anesthesia including but not limited to:  - adverse reactions to medications - damage to eyes, teeth, lips or other oral mucosa - nerve damage due to positioning  - sore throat or hoarseness - Damage to heart, brain, nerves, lungs, other parts of body or loss of life  Patient voiced understanding and assent.)         Anesthesia Quick Evaluation

## 2023-06-08 ENCOUNTER — Encounter: Payer: Self-pay | Admitting: Ophthalmology
